# Patient Record
Sex: Female | Born: 1980 | Race: Black or African American | Hispanic: No | Marital: Single | Smoking: Never smoker
Health system: Southern US, Community
[De-identification: ages and names within clinical notes are randomized; demographics above are authoritative.]

## PROBLEM LIST (undated history)

## (undated) DIAGNOSIS — E119 Type 2 diabetes mellitus without complications: Secondary | ICD-10-CM

## (undated) HISTORY — DX: Type 2 diabetes mellitus without complications: E11.9

## (undated) HISTORY — PX: BUNIONECTOMY: SHX129

---

## 2002-11-28 ENCOUNTER — Emergency Department (HOSPITAL_COMMUNITY): Admission: EM | Admit: 2002-11-28 | Discharge: 2002-11-28 | Payer: Self-pay | Admitting: Emergency Medicine

## 2004-03-19 ENCOUNTER — Emergency Department (HOSPITAL_COMMUNITY): Admission: EM | Admit: 2004-03-19 | Discharge: 2004-03-19 | Payer: Self-pay | Admitting: Emergency Medicine

## 2004-04-21 ENCOUNTER — Encounter: Admission: RE | Admit: 2004-04-21 | Discharge: 2004-06-12 | Payer: Self-pay | Admitting: Orthopedic Surgery

## 2005-01-22 ENCOUNTER — Ambulatory Visit: Payer: Self-pay | Admitting: Pulmonary Disease

## 2005-02-05 ENCOUNTER — Ambulatory Visit: Payer: Self-pay | Admitting: Pulmonary Disease

## 2006-02-17 ENCOUNTER — Emergency Department (HOSPITAL_COMMUNITY): Admission: EM | Admit: 2006-02-17 | Discharge: 2006-02-17 | Payer: Self-pay | Admitting: Emergency Medicine

## 2007-12-02 ENCOUNTER — Inpatient Hospital Stay (HOSPITAL_COMMUNITY): Admission: AD | Admit: 2007-12-02 | Discharge: 2007-12-02 | Payer: Self-pay | Admitting: Gynecology

## 2007-12-05 ENCOUNTER — Inpatient Hospital Stay (HOSPITAL_COMMUNITY): Admission: AD | Admit: 2007-12-05 | Discharge: 2007-12-05 | Payer: Self-pay | Admitting: Gynecology

## 2008-06-11 ENCOUNTER — Inpatient Hospital Stay (HOSPITAL_COMMUNITY): Admission: AD | Admit: 2008-06-11 | Discharge: 2008-06-12 | Payer: Self-pay | Admitting: Obstetrics and Gynecology

## 2008-11-01 ENCOUNTER — Inpatient Hospital Stay (HOSPITAL_COMMUNITY): Admission: AD | Admit: 2008-11-01 | Discharge: 2008-11-03 | Payer: Self-pay | Admitting: Obstetrics and Gynecology

## 2011-09-09 LAB — URINALYSIS, ROUTINE W REFLEX MICROSCOPIC
Bilirubin Urine: NEGATIVE
Ketones, ur: NEGATIVE
pH: 5.5

## 2011-09-14 LAB — CBC
HCT: 31.7 — ABNORMAL LOW
HCT: 39.3
Hemoglobin: 10.8 — ABNORMAL LOW
MCHC: 33.2
MCV: 86.8
Platelets: 179
Platelets: 194
RBC: 4.5
RDW: 13.9

## 2011-09-14 LAB — RPR: RPR Ser Ql: NONREACTIVE

## 2011-09-17 LAB — URINALYSIS, ROUTINE W REFLEX MICROSCOPIC
Glucose, UA: NEGATIVE
Ketones, ur: NEGATIVE
Leukocytes, UA: NEGATIVE
Nitrite: NEGATIVE
Protein, ur: NEGATIVE

## 2011-09-17 LAB — URINE MICROSCOPIC-ADD ON

## 2011-09-17 LAB — POCT PREGNANCY, URINE
Operator id: 27290
Preg Test, Ur: NEGATIVE

## 2011-09-17 LAB — GC/CHLAMYDIA PROBE AMP, GENITAL: GC Probe Amp, Genital: NEGATIVE

## 2011-09-17 LAB — WET PREP, GENITAL: Trich, Wet Prep: NONE SEEN

## 2011-09-17 LAB — HCG, QUANTITATIVE, PREGNANCY: hCG, Beta Chain, Quant, S: 6 — ABNORMAL HIGH

## 2012-06-12 ENCOUNTER — Emergency Department (HOSPITAL_COMMUNITY)
Admission: EM | Admit: 2012-06-12 | Discharge: 2012-06-13 | Disposition: A | Discharge status: Home / Self Care | Payer: BC Managed Care – PPO | Attending: Emergency Medicine | Admitting: Emergency Medicine

## 2012-06-12 ENCOUNTER — Encounter (HOSPITAL_COMMUNITY): Payer: Self-pay | Admitting: Emergency Medicine

## 2012-06-12 DIAGNOSIS — M25519 Pain in unspecified shoulder: Secondary | ICD-10-CM | POA: Insufficient documentation

## 2012-06-12 DIAGNOSIS — S39012A Strain of muscle, fascia and tendon of lower back, initial encounter: Secondary | ICD-10-CM

## 2012-06-12 DIAGNOSIS — M542 Cervicalgia: Secondary | ICD-10-CM | POA: Insufficient documentation

## 2012-06-12 DIAGNOSIS — M545 Low back pain, unspecified: Secondary | ICD-10-CM | POA: Insufficient documentation

## 2012-06-12 DIAGNOSIS — S161XXA Strain of muscle, fascia and tendon at neck level, initial encounter: Secondary | ICD-10-CM

## 2012-06-12 DIAGNOSIS — S335XXA Sprain of ligaments of lumbar spine, initial encounter: Secondary | ICD-10-CM | POA: Insufficient documentation

## 2012-06-12 DIAGNOSIS — S139XXA Sprain of joints and ligaments of unspecified parts of neck, initial encounter: Secondary | ICD-10-CM | POA: Insufficient documentation
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
## 2012-06-13 ENCOUNTER — Emergency Department (HOSPITAL_COMMUNITY): Payer: BC Managed Care – PPO

## 2012-06-13 MED ORDER — CYCLOBENZAPRINE HCL 5 MG PO TABS: 5.0000 mg | ORAL_TABLET | Freq: Three times a day (TID) | ORAL | Status: AC | PRN

## 2012-06-13 MED ORDER — HYDROCODONE-ACETAMINOPHEN 5-325 MG PO TABS: 1.0000 | ORAL_TABLET | ORAL | Status: AC | PRN

## 2012-06-13 MED ORDER — HYDROCODONE-ACETAMINOPHEN 5-325 MG PO TABS
1.0000 | ORAL_TABLET | Freq: Once | ORAL | Status: AC
  Administered 2012-06-13: 1 via ORAL
  Filled 2012-06-13: qty 1

## 2012-06-13 MED ORDER — CYCLOBENZAPRINE HCL 10 MG PO TABS
5.0000 mg | ORAL_TABLET | Freq: Once | ORAL | Status: AC
  Administered 2012-06-13: 5 mg via ORAL
  Filled 2012-06-13: qty 1
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
## 2012-06-13 NOTE — Discharge Instructions (Signed)
Cervical Sprain A cervical sprain is when the ligaments in the neck stretch or tear. The ligaments are the tissues that hold the neck bones in place. HOME CARE   Put ice on the injured area.   Put ice in a plastic bag.   Place a towel between your skin and the bag.   Leave the ice on for 15 to 20 minutes, 3 to 4 times a day.   Only take medicine as told by your doctor.   Keep all doctor visits as told.   Keep all physical therapy visits as told.   If your doctor gives you a neck collar, wear it as told.   Do not drive while wearing a neck collar.   Adjust your work station so that you have good posture while you work.   Avoid positions and activities that make your problems worse.   Warm up and stretch before being active.  GET HELP RIGHT AWAY IF:   You are bleeding or your stomach is upset.   You have an allergic reaction to your medicine.   Your problems (symptoms) get worse.   You develop new problems.   You lose feeling (numbness) or you cannot move (paralysis) any part of your body.   You have tingling or weakness in any part of your body.   Your pain is not controlled with medicine.   You cannot take less pain medicine over time as planned.   Your activity level does not improve as expected.  MAKE SURE YOU:   Understand these instructions.   Will watch your condition.   Will get help right away if you are not doing well or get worse.  Document Released: 05/17/2008 Document Revised: 11/18/2011 Document Reviewed: 09/02/2011 [facility] Patient Information 2012 [place], [place].Lumbosacral Strain Lumbosacral strain is one of the most common causes of back pain. There are many causes of back pain. Most are not serious conditions. CAUSES  Your backbone (spinal column) is made up of 24 main vertebral bodies, the sacrum, and the coccyx. These are held together by muscles and tough, fibrous tissue (ligaments). Nerve roots pass through the openings between the  vertebrae. A sudden move or injury to the back may cause injury to, or pressure on, these nerves. This may result in localized back pain or pain movement (radiation) into the buttocks, down the leg, and into the foot. Sharp, shooting pain from the buttock down the back of the leg (sciatica) is frequently associated with a ruptured (herniated) disk. Pain may be caused by muscle spasm alone. Your caregiver can often find the cause of your pain by the details of your symptoms and an exam. In some cases, you may need tests (such as X-rays). Your caregiver will work with you to decide if any tests are needed based on your specific exam. HOME CARE INSTRUCTIONS   Avoid an underactive lifestyle. Active exercise, as directed by your caregiver, is your greatest weapon against back pain.   Avoid hard physical activities (tennis, racquetball, waterskiing) if you are not in proper physical condition for it. This may aggravate or create problems.   If you have a back problem, avoid sports requiring sudden body movements. Swimming and walking are generally safer activities.   Maintain good posture.   Avoid becoming overweight (obese).   Use bed rest for only the most extreme, sudden (acute) episode. Your caregiver will help you determine how much bed rest is necessary.   For acute conditions, you may put ice on the  injured area.   Put ice in a plastic bag.   Place a towel between your skin and the bag.   Leave the ice on for 15 to 20 minutes at a time, every 2 hours, or as needed.   After you are improved and more active, it may help to apply heat for 30 minutes before activities.  See your caregiver if you are having pain that lasts longer than expected. Your caregiver can advise appropriate exercises or therapy if needed. With conditioning, most back problems can be avoided. SEEK IMMEDIATE MEDICAL CARE IF:   You have numbness, tingling, weakness, or problems with the use of your arms or legs.   You  experience severe back pain not relieved with medicines.   There is a change in bowel or bladder control.   You have increasing pain in any area of the body, including your belly (abdomen).   You notice shortness of breath, dizziness, or feel faint.   You feel sick to your stomach (nauseous), are throwing up (vomiting), or become sweaty.   You notice discoloration of your toes or legs, or your feet get very cold.   Your back pain is getting worse.   You have a fever.  MAKE SURE YOU:   Understand these instructions.   Will watch your condition.   Will get help right away if you are not doing well or get worse.  Document Released: 09/08/2005 Document Revised: 11/18/2011 Document Reviewed: 02/28/2009 [facility] Patient Information 2012 [place], [place].
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
## 2012-06-13 NOTE — ED Provider Notes (Signed)
Medical screening examination/treatment/procedure(s) were performed by non-physician practitioner and as supervising physician I was immediately available for consultation/collaboration.  Toy Baker, MD 06/13/12 [phone]
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
## 2022-07-08 ENCOUNTER — Emergency Department (HOSPITAL_COMMUNITY): Payer: Managed Care, Other (non HMO)

## 2022-07-08 ENCOUNTER — Other Ambulatory Visit: Payer: Self-pay

## 2022-07-08 ENCOUNTER — Emergency Department (HOSPITAL_COMMUNITY)
Admission: EM | Admit: 2022-07-08 | Discharge: 2022-07-08 | Disposition: A | Discharge status: Home / Self Care | Payer: Managed Care, Other (non HMO) | Attending: Emergency Medicine | Admitting: Emergency Medicine

## 2022-07-08 ENCOUNTER — Encounter (HOSPITAL_COMMUNITY): Payer: Self-pay | Admitting: Emergency Medicine

## 2022-07-08 DIAGNOSIS — R55 Syncope and collapse: Secondary | ICD-10-CM | POA: Diagnosis present

## 2022-07-08 DIAGNOSIS — S0990XA Unspecified injury of head, initial encounter: Secondary | ICD-10-CM

## 2022-07-08 DIAGNOSIS — R112 Nausea with vomiting, unspecified: Secondary | ICD-10-CM | POA: Diagnosis not present

## 2022-07-08 DIAGNOSIS — W01198A Fall on same level from slipping, tripping and stumbling with subsequent striking against other object, initial encounter: Secondary | ICD-10-CM | POA: Diagnosis not present

## 2022-07-08 LAB — CBC WITH DIFFERENTIAL/PLATELET
Abs Immature Granulocytes: 0.03 10*3/uL (ref 0.00–0.07)
Basophils Absolute: 0.1 10*3/uL (ref 0.0–0.1)
Basophils Relative: 1 %
Eosinophils Absolute: 0.1 10*3/uL (ref 0.0–0.5)
Eosinophils Relative: 1 %
HCT: 39.3 % (ref 36.0–46.0)
Hemoglobin: 13.2 g/dL (ref 12.0–15.0)
Immature Granulocytes: 0 %
Lymphocytes Relative: 17 %
Lymphs Abs: 1.6 10*3/uL (ref 0.7–4.0)
MCH: 28.3 pg (ref 26.0–34.0)
MCHC: 33.6 g/dL (ref 30.0–36.0)
MCV: 84.3 fL (ref 80.0–100.0)
Monocytes Absolute: 0.9 10*3/uL (ref 0.1–1.0)
Monocytes Relative: 9 %
Neutro Abs: 7 10*3/uL (ref 1.7–7.7)
Neutrophils Relative %: 72 %
Platelets: 254 10*3/uL (ref 150–400)
RBC: 4.66 MIL/uL (ref 3.87–5.11)
RDW: 13.1 % (ref 11.5–15.5)
Smear Review: NORMAL
WBC: 9.6 10*3/uL (ref 4.0–10.5)
nRBC: 0 % (ref 0.0–0.2)

## 2022-07-08 LAB — COMPREHENSIVE METABOLIC PANEL
ALT: 17 U/L (ref 0–44)
AST: 18 U/L (ref 15–41)
Albumin: 4 g/dL (ref 3.5–5.0)
Alkaline Phosphatase: 61 U/L (ref 38–126)
Anion gap: 10 (ref 5–15)
BUN: 17 mg/dL (ref 6–20)
CO2: 22 mmol/L (ref 22–32)
Calcium: 9.7 mg/dL (ref 8.9–10.3)
Chloride: 105 mmol/L (ref 98–111)
Creatinine, Ser: 1.01 mg/dL — ABNORMAL HIGH (ref 0.44–1.00)
GFR, Estimated: >60 mL/min (ref >60)
Glucose, Bld: 109 mg/dL — ABNORMAL HIGH (ref 70–99)
Potassium: 4 mmol/L (ref 3.5–5.1)
Sodium: 137 mmol/L (ref 135–145)
Total Bilirubin: 0.3 mg/dL (ref 0.3–1.2)
Total Protein: 7.3 g/dL (ref 6.5–8.1)

## 2022-07-08 LAB — URINALYSIS, ROUTINE W REFLEX MICROSCOPIC
Bacteria, UA: NONE SEEN
Bilirubin Urine: NEGATIVE
Glucose, UA: NEGATIVE mg/dL
Hgb urine dipstick: NEGATIVE
Ketones, ur: NEGATIVE mg/dL
Nitrite: NEGATIVE
Protein, ur: NEGATIVE mg/dL
Specific Gravity, Urine: 1.019 (ref 1.005–1.030)
pH: 5 (ref 5.0–8.0)

## 2022-07-08 LAB — CBG MONITORING, ED: Glucose-Capillary: 121 mg/dL — ABNORMAL HIGH (ref 70–99)

## 2022-07-08 LAB — I-STAT BETA HCG BLOOD, ED (MC, WL, AP ONLY): I-stat hCG, quantitative: <5 m[IU]/mL (ref <5)

## 2022-07-08 LAB — MAGNESIUM: Magnesium: 2.1 mg/dL (ref 1.7–2.4)

## 2022-07-08 MED ORDER — SODIUM CHLORIDE 0.9 % IV BOLUS: 1000.0000 mL | Freq: Once | INTRAVENOUS | Status: DC
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
## 2022-07-08 NOTE — Discharge Instructions (Addendum)
You came to the emergency department today to be evaluated after a syncopal episode and a head injury.  Your lab results, physical exam, and CT imaging were reassuring.  The exact cause of your syncopal episode could not be determined today.  Please follow-up closely with your primary care doctor for repeat assessment.  Get help right away if: You faint. You hit your head or are injured after fainting. You have any of these symptoms that may indicate trouble with your heart: Fast or irregular heartbeats (palpitations). Unusual pain in your chest, abdomen, or back. Shortness of breath. You have a seizure. You have a severe headache. You are confused. You have vision problems. You have severe weakness or trouble walking. You are bleeding from your mouth or rectum, or you have black or tarry stool.

## 2022-07-08 NOTE — ED Notes (Signed)
Patient verbalizes understanding of discharge instructions. Opportunity for questioning and answers were provided. Armband removed by staff, pt discharged from ED and ambulated to lobby to return home.   
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
## 2022-07-08 NOTE — ED Notes (Signed)
Per PIT PA no c-collar required.

## 2022-07-08 NOTE — ED Provider Notes (Signed)
[facility] EMERGENCY DEPARTMENT Provider Note   CSN: [id number] Arrival date & time: 07/08/22  0202     History  Chief Complaint  Patient presents with   Loss of Consciousness    Sharon Stanley is a 41 y.o. female with a history of diabetes mellitus type 2.  Presents to the emergency department complaint of syncope.  Patient reports that yesterday evening she was out on a hot patio with friends at a restaurant.  Patient reports that she has 1 small glass of beer.  Patient was standing up and felt like her sugar was dropping.  She began to feel lightheaded and nauseated.  Patient had syncopal episode x2.  Patient fell to the ground and hit the occipital region of her head on the ground.  Patient vomited 3-4 times after hitting her head.  Patient noted a slight headache after this event.  Patient had no preceding chest pain, shortness of breath, or sudden onset of headache.  Patient reports that she has not had any lightheadedness or syncopal episodes since the incident.  Patient had resolution of her headache.  Patient has had no father nausea or vomiting.  Patient denies any illicit drug use.  Patient denies any changes in medications.  Patient has not on any blood thinners.     Loss of Consciousness Associated symptoms: nausea and vomiting   Associated symptoms: no chest pain, no confusion, no dizziness, no fever, no headaches, no seizures, no shortness of breath and no weakness        Home Medications Prior to Admission medications   Not on File      Allergies    Patient has no known allergies.    Review of Systems   Review of Systems  Constitutional:  Negative for chills and fever.  Eyes:  Negative for visual disturbance.  Respiratory:  Negative for shortness of breath.   Cardiovascular:  Positive for syncope. Negative for chest pain.  Gastrointestinal:  Positive for nausea and vomiting. Negative for abdominal pain, anal bleeding and blood in stool.   Genitourinary:  Negative for difficulty urinating, dysuria, hematuria, vaginal discharge and vaginal pain.  Musculoskeletal:  Negative for back pain and neck pain.  Skin:  Negative for color change and rash.  Neurological:  Positive for syncope and light-headedness. Negative for dizziness, tremors, seizures, facial asymmetry, speech difficulty, weakness, numbness and headaches.  Psychiatric/Behavioral:  Negative for confusion.     Physical Exam Updated Vital Signs BP 123/81   Pulse 66   Temp 98.3 F (36.8 C) (Oral)   Resp 11   Ht 5\' 6"  (1.676 m)   Wt 63.5 kg   SpO2 98%   BMI 22.60 kg/m  Physical Exam Vitals and nursing note reviewed.  Constitutional:      General: She is not in acute distress.    Appearance: She is not ill-appearing, toxic-appearing or diaphoretic.  HENT:     Head: Normocephalic and atraumatic. No raccoon eyes, Battle's sign, abrasion, contusion, right periorbital erythema, left periorbital erythema or laceration.  Eyes:     General:        Right eye: No discharge.        Left eye: No discharge.     Extraocular Movements: Extraocular movements intact.     Conjunctiva/sclera: Conjunctivae normal.     Pupils: Pupils are equal, round, and reactive to light.  Cardiovascular:     Rate and Rhythm: Normal rate.     Pulses:  Radial pulses are 2+ on the right side and 2+ on the left side.  Pulmonary:     Effort: Pulmonary effort is normal.  Musculoskeletal:     Cervical back: Normal range of motion and neck supple. No rigidity.     Comments: No midline tenderness or deformity to cervical, thoracic, or lumbar spine.  No tenderness, bony tenderness, or deformity to bilateral upper or lower extremities.  Skin:    General: Skin is warm and dry.  Neurological:     General: No focal deficit present.     Mental Status: She is alert.     GCS: GCS eye subscore is 4. GCS verbal subscore is 5. GCS motor subscore is 6.     Cranial Nerves: Cranial nerves 2-12 are  intact. No cranial nerve deficit, dysarthria or facial asymmetry.     Sensory: Sensation is intact.     Motor: No weakness, tremor, seizure activity or pronator drift.     Coordination: Finger-Nose-Finger Test normal.     Gait: Gait is intact. Gait normal.     Comments: CN II-XII intact, equal grip strength, +5 strength to bilateral upper and lower extremities, sensation to light touch is grossly intact to bilateral upper and lower extremities.  Psychiatric:        Behavior: Behavior is cooperative.     ED Results / Procedures / Treatments   Labs (all labs ordered are listed, but only abnormal results are displayed) Labs Reviewed  COMPREHENSIVE METABOLIC PANEL - Abnormal; Notable for the following components:      Result Value   Glucose, Bld 109 (*)    Creatinine, Ser 1.01 (*)    All other components within normal limits  URINALYSIS, ROUTINE W REFLEX MICROSCOPIC - Abnormal; Notable for the following components:   APPearance HAZY (*)    Leukocytes,Ua TRACE (*)    All other components within normal limits  CBG MONITORING, ED - Abnormal; Notable for the following components:   Glucose-Capillary 121 (*)    All other components within normal limits  CBC WITH DIFFERENTIAL/PLATELET  MAGNESIUM  I-STAT BETA HCG BLOOD, ED ([facility], [facility], [facility] ONLY)    EKG EKG Interpretation  Date/Time:  Thursday July 08 2022 02:34:16 EDT Ventricular Rate:  84 PR Interval:  136 QRS Duration: 66 QT Interval:  354 QTC Calculation: 418 R Axis:   39 Text Interpretation: Normal sinus rhythm Septal infarct , age undetermined Abnormal ECG No previous ECGs available Confirmed by Ross Marcus ([id number]) on 07/08/2022 4:04:59 AM  Radiology CT HEAD WO CONTRAST  Result Date: 07/08/2022 CLINICAL DATA:  Syncope, fall, head injury, vomiting EXAM: CT HEAD WITHOUT CONTRAST CT CERVICAL SPINE WITHOUT CONTRAST TECHNIQUE: Multidetector CT imaging of the head and cervical spine was performed following the standard protocol  without intravenous contrast. Multiplanar CT image reconstructions of the cervical spine were also generated. RADIATION DOSE REDUCTION: This exam was performed according to the departmental dose-optimization program which includes automated exposure control, adjustment of the mA and/or kV according to patient size and/or use of iterative reconstruction technique. COMPARISON:  None Available. FINDINGS: CT HEAD FINDINGS Brain: No evidence of acute infarction, hemorrhage, hydrocephalus, extra-axial collection or mass lesion/mass effect. Vascular: No hyperdense vessel or unexpected calcification. Skull: Normal. Negative for fracture or focal lesion. Sinuses/Orbits: The visualized paranasal sinuses are essentially clear. The mastoid air cells are unopacified. Other: None. CT CERVICAL SPINE FINDINGS Alignment: Normal cervical lordosis. Skull base and vertebrae: No acute fracture. No primary bone lesion or focal pathologic process. Soft tissues  and spinal canal: No prevertebral fluid or swelling. No visible canal hematoma. Disc levels: Mild degenerative changes of the mid cervical spine. Spinal canal is patent. Upper chest: Visualized lung apices are clear. Other: Visualized thyroid is unremarkable. IMPRESSION: Normal head CT. Normal cervical spine CT. Electronically Signed   By: Charline Bills M.D.   On: 07/08/2022 03:16   CT CERVICAL SPINE WO CONTRAST  Result Date: 07/08/2022 CLINICAL DATA:  Syncope, fall, head injury, vomiting EXAM: CT HEAD WITHOUT CONTRAST CT CERVICAL SPINE WITHOUT CONTRAST TECHNIQUE: Multidetector CT imaging of the head and cervical spine was performed following the standard protocol without intravenous contrast. Multiplanar CT image reconstructions of the cervical spine were also generated. RADIATION DOSE REDUCTION: This exam was performed according to the departmental dose-optimization program which includes automated exposure control, adjustment of the mA and/or kV according to patient size  and/or use of iterative reconstruction technique. COMPARISON:  None Available. FINDINGS: CT HEAD FINDINGS Brain: No evidence of acute infarction, hemorrhage, hydrocephalus, extra-axial collection or mass lesion/mass effect. Vascular: No hyperdense vessel or unexpected calcification. Skull: Normal. Negative for fracture or focal lesion. Sinuses/Orbits: The visualized paranasal sinuses are essentially clear. The mastoid air cells are unopacified. Other: None. CT CERVICAL SPINE FINDINGS Alignment: Normal cervical lordosis. Skull base and vertebrae: No acute fracture. No primary bone lesion or focal pathologic process. Soft tissues and spinal canal: No prevertebral fluid or swelling. No visible canal hematoma. Disc levels: Mild degenerative changes of the mid cervical spine. Spinal canal is patent. Upper chest: Visualized lung apices are clear. Other: Visualized thyroid is unremarkable. IMPRESSION: Normal head CT. Normal cervical spine CT. Electronically Signed   By: Charline Bills M.D.   On: 07/08/2022 03:16    Procedures Procedures    Medications Ordered in ED Medications  sodium chloride 0.9 % bolus 1,000 mL (0 mLs Intravenous Hold 07/08/22 0435)    ED Course/ Medical Decision Making/ A&P                           Medical Decision Making  Alert 41 year old female no acute stress, nontoxic-appearing.  Presents emerged department complaint of syncopal episode.  Information is obtained from patient.  I reviewed patient's past medical records including previous provider notes, labs, and imaging.  Patient's medical history as outlined in HPI which complicates her care.  Due to multiple episodes of vomiting after head injury noncontrast head CT and cervical spine CT were obtained while patient was in triage.  Due to reports of syncope CMP, CBC, hCG, CBG, and urinalysis were obtained while patient was in triage.  Additionally will check orthostatic vital signs at this time.  I personally viewed and  interpreted patient's EKG.  Tracing shows sinus rhythm.  I personally viewed and interpreted patient CT imaging.  Agree with radiology interpretation of no acute intracranial abnormality, no acute osseous abnormality to cervical spine.  I personally viewed and interpret patient's lab results.  Pertinent findings include: -I-STAT beta-hCG less than 5 -CMP 1.01 -CBC and urinalysis unremarkable  Orthostatic vitals showed no signs of orthostatic hypotension.  Patient is able to stand and ambulate without difficulty.  Patient is hemodynamically stable.  Will discharge patient to follow-up closely with her PCP.  Discussed strict return precautions  Based on patient's chief complaint, I considered admission might be necessary, however after reassuring ED workup feel patient is reasonable for discharge.  Discussed results, findings, treatment and follow up. Patient advised of return precautions. Patient verbalized understanding  and agreed with plan.  Portions of this note were generated with Scientist, clinical (histocompatibility and immunogenetics). Dictation errors may occur despite best attempts at proofreading.         Final Clinical Impression(s) / ED Diagnoses Final diagnoses:  None    Rx / DC Orders ED Discharge Orders     None         Haskel Schroeder, PA-C 07/08/22 0453    Shon Baton, MD 07/08/22 [phone]
